# Patient Record
Sex: Male | Born: 1955 | Race: White | Hispanic: No | Marital: Married | Smoking: Former smoker
Health system: Southern US, Community
[De-identification: ages and names within clinical notes are randomized; demographics above are authoritative.]

## PROBLEM LIST (undated history)

## (undated) DIAGNOSIS — R569 Unspecified convulsions: Secondary | ICD-10-CM

## (undated) DIAGNOSIS — F419 Anxiety disorder, unspecified: Secondary | ICD-10-CM

## (undated) DIAGNOSIS — J449 Chronic obstructive pulmonary disease, unspecified: Secondary | ICD-10-CM

## (undated) HISTORY — PX: TOTAL HIP ARTHROPLASTY: SHX124

## (undated) HISTORY — PX: CRANIOTOMY: SHX93

## (undated) HISTORY — PX: APPENDECTOMY: SHX54

---

## 2020-04-10 LAB — PULMONARY FUNCTION TEST

## 2020-11-04 ENCOUNTER — Emergency Department (HOSPITAL_BASED_OUTPATIENT_CLINIC_OR_DEPARTMENT_OTHER): Payer: Medicare Other

## 2020-11-04 ENCOUNTER — Encounter (HOSPITAL_BASED_OUTPATIENT_CLINIC_OR_DEPARTMENT_OTHER): Payer: Self-pay | Admitting: Emergency Medicine

## 2020-11-04 ENCOUNTER — Emergency Department (HOSPITAL_BASED_OUTPATIENT_CLINIC_OR_DEPARTMENT_OTHER)
Admission: EM | Admit: 2020-11-04 | Discharge: 2020-11-04 | Disposition: A | Discharge status: Home / Self Care | Payer: Medicare Other | Attending: Emergency Medicine | Admitting: Emergency Medicine

## 2020-11-04 ENCOUNTER — Other Ambulatory Visit: Payer: Self-pay

## 2020-11-04 DIAGNOSIS — R0602 Shortness of breath: Secondary | ICD-10-CM | POA: Diagnosis not present

## 2020-11-04 DIAGNOSIS — R0789 Other chest pain: Secondary | ICD-10-CM

## 2020-11-04 DIAGNOSIS — J449 Chronic obstructive pulmonary disease, unspecified: Secondary | ICD-10-CM | POA: Diagnosis not present

## 2020-11-04 DIAGNOSIS — Z96641 Presence of right artificial hip joint: Secondary | ICD-10-CM | POA: Diagnosis not present

## 2020-11-04 DIAGNOSIS — Z87891 Personal history of nicotine dependence: Secondary | ICD-10-CM | POA: Insufficient documentation

## 2020-11-04 DIAGNOSIS — R0781 Pleurodynia: Secondary | ICD-10-CM | POA: Insufficient documentation

## 2020-11-04 HISTORY — DX: Chronic obstructive pulmonary disease, unspecified: J44.9

## 2020-11-04 HISTORY — DX: Unspecified convulsions: R56.9

## 2020-11-04 HISTORY — DX: Anxiety disorder, unspecified: F41.9

## 2020-11-04 MED ORDER — ACETAMINOPHEN 500 MG PO TABS
1000.0000 mg | ORAL_TABLET | Freq: Once | ORAL | Status: AC
  Administered 2020-11-04: 1000 mg via ORAL
  Filled 2020-11-04: qty 2

## 2020-11-04 NOTE — Discharge Instructions (Addendum)
You have been seen and discharged from the emergency department.  Your x-ray and CAT scan shows old rib fractures, no new acute findings.  Lung otherwise looks normal.  Treat your pain with Tylenol/ibuprofen.  Follow-up with your primary provider for reevaluation and further care. Take home medications as prescribed. If you have any worsening symptoms or further concerns for your health please return to an emergency department for further evaluation.

## 2020-11-04 NOTE — ED Notes (Signed)
Pt discharged home after verbalizing understanding of discharge instructions; nad noted. 

## 2020-11-04 NOTE — ED Notes (Signed)
Patient verbalizes understanding of discharge instructions. Opportunity for questioning and answers were provided. Patient discharged from ED.  °

## 2020-11-04 NOTE — ED Triage Notes (Signed)
Pt arrives to ED with c/o of left sided rib cage pain x3 days. Pt reports he reached across the car and hurt his left lower ribs on the middle console while reaching for something that fell x3 days ago. This same situation happened again yesterday that worsened the pain. Today pt awoke extremely SOB and now having worsening left lower rib pain. Pt w/ of COPD.
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
## 2021-02-27 ENCOUNTER — Encounter (HOSPITAL_BASED_OUTPATIENT_CLINIC_OR_DEPARTMENT_OTHER): Payer: Self-pay | Admitting: Family Medicine

## 2021-02-27 ENCOUNTER — Other Ambulatory Visit: Payer: Self-pay

## 2021-02-27 ENCOUNTER — Ambulatory Visit (INDEPENDENT_AMBULATORY_CARE_PROVIDER_SITE_OTHER): Payer: Medicare Other | Admitting: Family Medicine

## 2021-02-27 VITALS — BP 131/70 | HR 58 | Ht 76.0 in | Wt 272.0 lb

## 2021-02-27 DIAGNOSIS — G8929 Other chronic pain: Secondary | ICD-10-CM

## 2021-02-27 DIAGNOSIS — J449 Chronic obstructive pulmonary disease, unspecified: Secondary | ICD-10-CM

## 2021-02-27 DIAGNOSIS — F419 Anxiety disorder, unspecified: Secondary | ICD-10-CM | POA: Diagnosis not present

## 2021-02-27 DIAGNOSIS — Z0000 Encounter for general adult medical examination without abnormal findings: Secondary | ICD-10-CM

## 2021-02-27 DIAGNOSIS — M25561 Pain in right knee: Secondary | ICD-10-CM | POA: Diagnosis not present

## 2021-02-27 MED ORDER — RISPERIDONE 1 MG PO TABS: 1.0000 mg | ORAL_TABLET | Freq: Every day | ORAL | 1 refills | Status: DC

## 2021-02-27 MED ORDER — CLONAZEPAM 0.5 MG PO TABS: 0.2500 mg | ORAL_TABLET | Freq: Two times a day (BID) | ORAL | 0 refills | Status: DC

## 2021-02-27 NOTE — Assessment & Plan Note (Signed)
Can continue with current medications, refill of clonazepam today Feel that working towards gradual discontinuation of clonazepam would be best, given that they will be going out of town relatively soon for a prolonged period of time, will hold off on further weaning at this time and plan to resume once they return from their trip

## 2021-02-27 NOTE — Addendum Note (Signed)
Addended by: DE Peru, Marcy Salvo J on: 02/27/2021 03:27 PM   Modules accepted: Orders

## 2021-02-27 NOTE — Assessment & Plan Note (Signed)
Diagnosed on PFTs with prior pulmonologist Not currently requiring any inhalers, not currently symptomatic Will refer to new pulmonologist for patient to establish for continued monitoring and management

## 2021-02-27 NOTE — Assessment & Plan Note (Signed)
Exam mostly unremarkable except for joint line tenderness Discussed possible etiologies, most likely related to arthritis We will proceed with x-ray imaging, further management recommendations pending results of imaging May continue with conservative measures including OTC medications, icing to help with symptoms
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
## 2021-02-27 NOTE — Patient Instructions (Addendum)
°  Medication Instructions:  Your physician recommends that you continue on your current medications as directed. Please refer to the Current Medication list given to you today. --If you need a refill on any your medications before your next appointment, please call your pharmacy first. If no refills are authorized on file call the office.-- Referrals/Procedures/Imaging: A referral has been placed for you to [facility] Pulmonology for evaluation and treatment. Someone from the scheduling department will be in contact with you in regards to coordinating your consultation. If you do not hear from any of the schedulers within 7-10 business days please give their office a call.   Your physician recommends that you have an XRAY of your Right Knee. X-rays are a type of radiation called electromagnetic waves. X-ray imaging creates pictures of the inside of your body. The images show the parts of your body in different shades of black and white. This is because different tissues absorb different amounts of radiation. Calcium in bones absorbs x-rays the most, so bones look white. Fat and other soft tissues absorb less and look gray. Air absorbs the least, so lungs look black     1. You may have this done at the [facility], located in the [facility] Building on the 1st floor.    2. You do no have to have an appointment.    3. [address] [place], [place] [address]        [phone]        Monday - Friday  8:00 am - 5:00 pm 4. [facility]  [facility]. [facility] at [facility] is a premier diagnostic imaging center that offers a 64-slice CT scanner and interventional radiology services.   Follow-Up: Your next appointment:   Your physician recommends that you schedule a follow-up appointment in: 6-9 MONTHS for CPE with Dr. de Peru  You will receive a text message or e-mail with a link to a survey about  your care and experience with [place] today! We would greatly appreciate your feedback!   Thanks for letting us be apart of your health journey!!  Primary Care and Sports Medicine   Dr. Ceasar Mons Peru   We encourage you to activate your patient portal called "MyChart".  Sign up information is provided on this After Visit Summary.  MyChart is used to connect with patients for Virtual Visits (Telemedicine).  Patients are able to view lab/test results, encounter notes, upcoming appointments, etc.  Non-urgent messages can be sent to your provider as well. To learn more about what you can do with MyChart, please visit --  [email or web address].

## 2021-02-28 ENCOUNTER — Ambulatory Visit
Admission: RE | Admit: 2021-02-28 | Discharge: 2021-02-28 | Disposition: A | Discharge status: Home / Self Care | Payer: Medicare Other | Source: Ambulatory Visit | Attending: Family Medicine | Admitting: Family Medicine

## 2021-02-28 DIAGNOSIS — M25561 Pain in right knee: Secondary | ICD-10-CM | POA: Diagnosis not present

## 2021-02-28 DIAGNOSIS — G8929 Other chronic pain: Secondary | ICD-10-CM
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
## 2021-04-22 ENCOUNTER — Ambulatory Visit: Payer: Medicare Other | Admitting: Student

## 2021-04-22 ENCOUNTER — Encounter: Payer: Self-pay | Admitting: Student

## 2021-04-22 ENCOUNTER — Other Ambulatory Visit: Payer: Self-pay

## 2021-04-22 VITALS — BP 128/80 | HR 59 | Temp 98.0°F | Ht 78.0 in | Wt 278.0 lb

## 2021-04-22 DIAGNOSIS — F17200 Nicotine dependence, unspecified, uncomplicated: Secondary | ICD-10-CM

## 2021-04-22 DIAGNOSIS — J449 Chronic obstructive pulmonary disease, unspecified: Secondary | ICD-10-CM

## 2021-04-22 DIAGNOSIS — R053 Chronic cough: Secondary | ICD-10-CM

## 2021-04-22 NOTE — Patient Instructions (Addendum)
-   try flonase 1 spray each nostril after clearing nose of crusting following shower for at least a few weeks - he is due for lung cancer screening CT Chest 10/2021 - see you in 4-6 weeks

## 2021-05-19 ENCOUNTER — Other Ambulatory Visit: Payer: Self-pay | Admitting: *Deleted
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
## 2021-05-20 ENCOUNTER — Other Ambulatory Visit: Payer: Self-pay

## 2021-05-20 ENCOUNTER — Ambulatory Visit: Payer: Medicare Other | Admitting: Student

## 2021-05-20 DIAGNOSIS — J449 Chronic obstructive pulmonary disease, unspecified: Secondary | ICD-10-CM | POA: Diagnosis not present

## 2021-05-20 LAB — PULMONARY FUNCTION TEST
DL/VA % pred: 100 %
DL/VA: 4.01 ml/min/mmHg/L
DLCO cor % pred: 69 %
DLCO cor: 22.83 ml/min/mmHg
DLCO unc % pred: 69 %
DLCO unc: 22.83 ml/min/mmHg
FEF 25-75 Post: 2.75 L/sec
FEF 25-75 Pre: 1.75 L/sec
FEF2575-%Change-Post: 56 %
FEF2575-%Pred-Post: 81 %
FEF2575-%Pred-Pre: 52 %
FEV1-%Change-Post: 12 %
FEV1-%Pred-Post: 57 %
FEV1-%Pred-Pre: 51 %
FEV1-Post: 2.5 L
FEV1-Pre: 2.22 L
FEV1FVC-%Change-Post: 3 %
FEV1FVC-%Pred-Pre: 99 %
FEV6-%Change-Post: 8 %
FEV6-%Pred-Post: 58 %
FEV6-%Pred-Pre: 54 %
FEV6-Post: 3.26 L
FEV6-Pre: 2.99 L
FEV6FVC-%Pred-Post: 104 %
FEV6FVC-%Pred-Pre: 104 %
FVC-%Change-Post: 8 %
FVC-%Pred-Post: 56 %
FVC-%Pred-Pre: 51 %
FVC-Post: 3.26 L
FVC-Pre: 2.99 L
Post FEV1/FVC ratio: 77 %
Post FEV6/FVC ratio: 100 %
Pre FEV1/FVC ratio: 74 %
Pre FEV6/FVC Ratio: 100 %

## 2021-05-20 NOTE — Progress Notes (Signed)
Full PFT performed today. °

## 2021-05-20 NOTE — Patient Instructions (Signed)
Full PFT performed today. °

## 2021-05-21 ENCOUNTER — Encounter: Payer: Self-pay | Admitting: Student

## 2021-05-21 ENCOUNTER — Telehealth: Payer: Self-pay | Admitting: Student

## 2021-05-21 ENCOUNTER — Ambulatory Visit: Payer: Medicare Other | Admitting: Student

## 2021-05-21 VITALS — BP 104/80 | HR 57 | Temp 98.3°F | Ht 77.0 in | Wt 276.0 lb

## 2021-05-21 DIAGNOSIS — R053 Chronic cough: Secondary | ICD-10-CM | POA: Diagnosis not present

## 2021-05-21 DIAGNOSIS — J449 Chronic obstructive pulmonary disease, unspecified: Secondary | ICD-10-CM

## 2021-05-21 MED ORDER — ALBUTEROL SULFATE HFA 108 (90 BASE) MCG/ACT IN AERS: 2.0000 | INHALATION_SPRAY | Freq: Four times a day (QID) | RESPIRATORY_TRACT | 6 refills | Status: AC | PRN

## 2021-05-21 NOTE — Telephone Encounter (Signed)
What is least expensive non-DPI LABA/ICS for him? ? ? ?

## 2021-05-21 NOTE — Patient Instructions (Addendum)
-   can try albuterol 1-2 puffs as needed  ?- I will send a message to pharmacy to see which LABA/ICS (long acting beta agonist and inhaled corticosteroid inhaler) is most affordable if you guys decide to use it. This inhaler used everyday will decrease risk of COPD exacerbation, improve shortness of breath from COPD and is ideal when there is overlap with asthma.  ?- see you in 6 months  ?

## 2021-05-22 ENCOUNTER — Other Ambulatory Visit (HOSPITAL_COMMUNITY): Payer: Self-pay

## 2021-05-22 NOTE — Telephone Encounter (Signed)
Good morning! Test billing results are as follows: ?Symbicort- $47 ?Dulera- $100

## 2021-05-27 ENCOUNTER — Other Ambulatory Visit (HOSPITAL_BASED_OUTPATIENT_CLINIC_OR_DEPARTMENT_OTHER): Payer: Self-pay | Admitting: Family Medicine

## 2021-05-30 NOTE — Telephone Encounter (Signed)
Thanks for checking 

## 2021-06-11 ENCOUNTER — Other Ambulatory Visit (HOSPITAL_BASED_OUTPATIENT_CLINIC_OR_DEPARTMENT_OTHER): Payer: Self-pay | Admitting: Family Medicine

## 2021-07-11 ENCOUNTER — Other Ambulatory Visit (HOSPITAL_BASED_OUTPATIENT_CLINIC_OR_DEPARTMENT_OTHER): Payer: Self-pay | Admitting: Family Medicine

## 2021-08-06 ENCOUNTER — Other Ambulatory Visit (HOSPITAL_BASED_OUTPATIENT_CLINIC_OR_DEPARTMENT_OTHER): Payer: Self-pay | Admitting: Family Medicine

## 2021-08-07 NOTE — Telephone Encounter (Signed)
Pt called and requested refills on clonazePAM (KLONOPIN) 0.5mg  and wanted to know if the Vitamin D could be refilled as well. I refilled the propranolol and risperidone already.  Thanks,  [employer]

## 2021-08-08 MED ORDER — VITAMIN D (ERGOCALCIFEROL) 1.25 MG (50000 UNIT) PO CAPS: 50000.0000 [IU] | ORAL_CAPSULE | ORAL | 0 refills | Status: DC

## 2021-08-08 MED ORDER — CLONAZEPAM 0.5 MG PO TABS: 0.2500 mg | ORAL_TABLET | Freq: Two times a day (BID) | ORAL | 0 refills | Status: DC

## 2021-08-28 ENCOUNTER — Encounter (HOSPITAL_BASED_OUTPATIENT_CLINIC_OR_DEPARTMENT_OTHER): Payer: Medicare Other | Admitting: Family Medicine

## 2021-09-08 ENCOUNTER — Other Ambulatory Visit (HOSPITAL_BASED_OUTPATIENT_CLINIC_OR_DEPARTMENT_OTHER): Payer: Self-pay | Admitting: Family Medicine

## 2021-11-01 ENCOUNTER — Other Ambulatory Visit (HOSPITAL_BASED_OUTPATIENT_CLINIC_OR_DEPARTMENT_OTHER): Payer: Self-pay | Admitting: Family Medicine

## 2021-11-06 ENCOUNTER — Other Ambulatory Visit (HOSPITAL_BASED_OUTPATIENT_CLINIC_OR_DEPARTMENT_OTHER): Payer: Self-pay | Admitting: Family Medicine

## 2021-11-06 ENCOUNTER — Encounter (HOSPITAL_BASED_OUTPATIENT_CLINIC_OR_DEPARTMENT_OTHER): Payer: Self-pay

## 2021-11-06 MED ORDER — CLONAZEPAM 0.5 MG PO TABS: 0.2500 mg | ORAL_TABLET | Freq: Two times a day (BID) | ORAL | 0 refills | Status: DC

## 2021-11-17 ENCOUNTER — Other Ambulatory Visit (HOSPITAL_BASED_OUTPATIENT_CLINIC_OR_DEPARTMENT_OTHER): Payer: Self-pay | Admitting: Family Medicine

## 2021-12-02 NOTE — Telephone Encounter (Signed)
Error

## 2022-02-03 ENCOUNTER — Other Ambulatory Visit (HOSPITAL_BASED_OUTPATIENT_CLINIC_OR_DEPARTMENT_OTHER): Payer: Self-pay | Admitting: Family Medicine

## 2022-02-12 ENCOUNTER — Other Ambulatory Visit (HOSPITAL_BASED_OUTPATIENT_CLINIC_OR_DEPARTMENT_OTHER): Payer: Self-pay | Admitting: Family Medicine

## 2022-02-15 ENCOUNTER — Other Ambulatory Visit (HOSPITAL_BASED_OUTPATIENT_CLINIC_OR_DEPARTMENT_OTHER): Payer: Self-pay | Admitting: Family Medicine

## 2022-02-27 ENCOUNTER — Encounter (HOSPITAL_BASED_OUTPATIENT_CLINIC_OR_DEPARTMENT_OTHER): Payer: Medicare Other | Admitting: Family Medicine

## 2022-03-03 ENCOUNTER — Other Ambulatory Visit (HOSPITAL_BASED_OUTPATIENT_CLINIC_OR_DEPARTMENT_OTHER): Payer: Self-pay

## 2022-03-03 DIAGNOSIS — M25561 Pain in right knee: Secondary | ICD-10-CM

## 2022-03-03 MED ORDER — VITAMIN D (ERGOCALCIFEROL) 1.25 MG (50000 UNIT) PO CAPS: 50000.0000 [IU] | ORAL_CAPSULE | ORAL | 0 refills | Status: AC

## 2022-04-17 ENCOUNTER — Encounter (HOSPITAL_BASED_OUTPATIENT_CLINIC_OR_DEPARTMENT_OTHER): Payer: Self-pay

## 2022-05-02 ENCOUNTER — Other Ambulatory Visit (HOSPITAL_BASED_OUTPATIENT_CLINIC_OR_DEPARTMENT_OTHER): Payer: Self-pay | Admitting: Family Medicine

## 2022-05-06 ENCOUNTER — Other Ambulatory Visit (HOSPITAL_BASED_OUTPATIENT_CLINIC_OR_DEPARTMENT_OTHER): Payer: Self-pay | Admitting: Family Medicine

## 2022-05-20 ENCOUNTER — Other Ambulatory Visit (HOSPITAL_BASED_OUTPATIENT_CLINIC_OR_DEPARTMENT_OTHER): Payer: Self-pay | Admitting: Family Medicine

## 2022-05-21 ENCOUNTER — Other Ambulatory Visit (HOSPITAL_BASED_OUTPATIENT_CLINIC_OR_DEPARTMENT_OTHER): Payer: Self-pay | Admitting: Family Medicine

## 2022-05-22 ENCOUNTER — Other Ambulatory Visit (HOSPITAL_BASED_OUTPATIENT_CLINIC_OR_DEPARTMENT_OTHER): Payer: Self-pay | Admitting: Family Medicine

## 2022-06-10 NOTE — Telephone Encounter (Signed)
Pt has moved to [place], they will not be seeing [place]. I have wished them the best, and removed Dr Tommi Rumps [place] as the PCP.

## 2022-07-27 ENCOUNTER — Other Ambulatory Visit (HOSPITAL_BASED_OUTPATIENT_CLINIC_OR_DEPARTMENT_OTHER): Payer: Self-pay | Admitting: Family Medicine

## 2023-01-19 IMAGING — CR DG KNEE COMPLETE 4+V*R*
4 series · 4 of 4 positions shown · non-contrast
Comparison: None.

CLINICAL DATA: right knee pain. Right knee pain, medial and
lateral, ever since hip surgery.

EXAM:
RIGHT KNEE - COMPLETE 4+ VIEW

[w knee ap right]
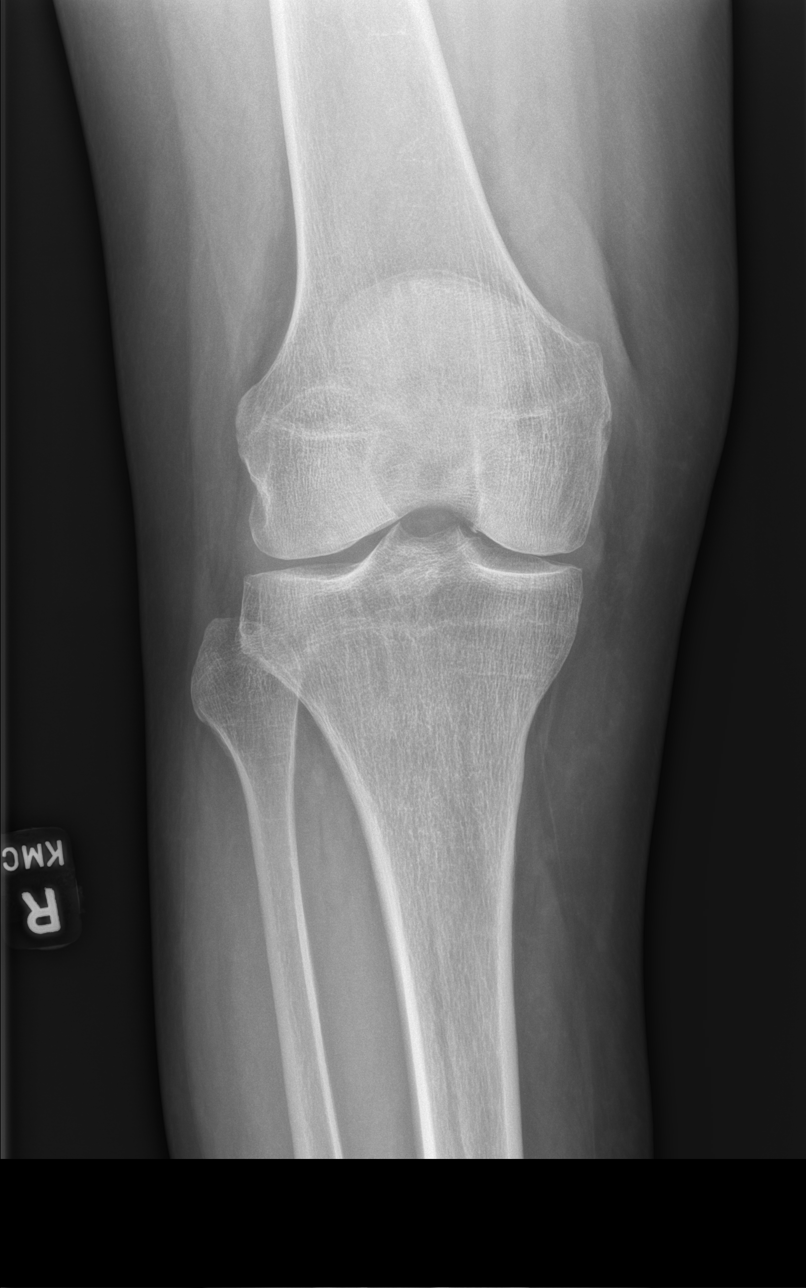

[w knee lat right]
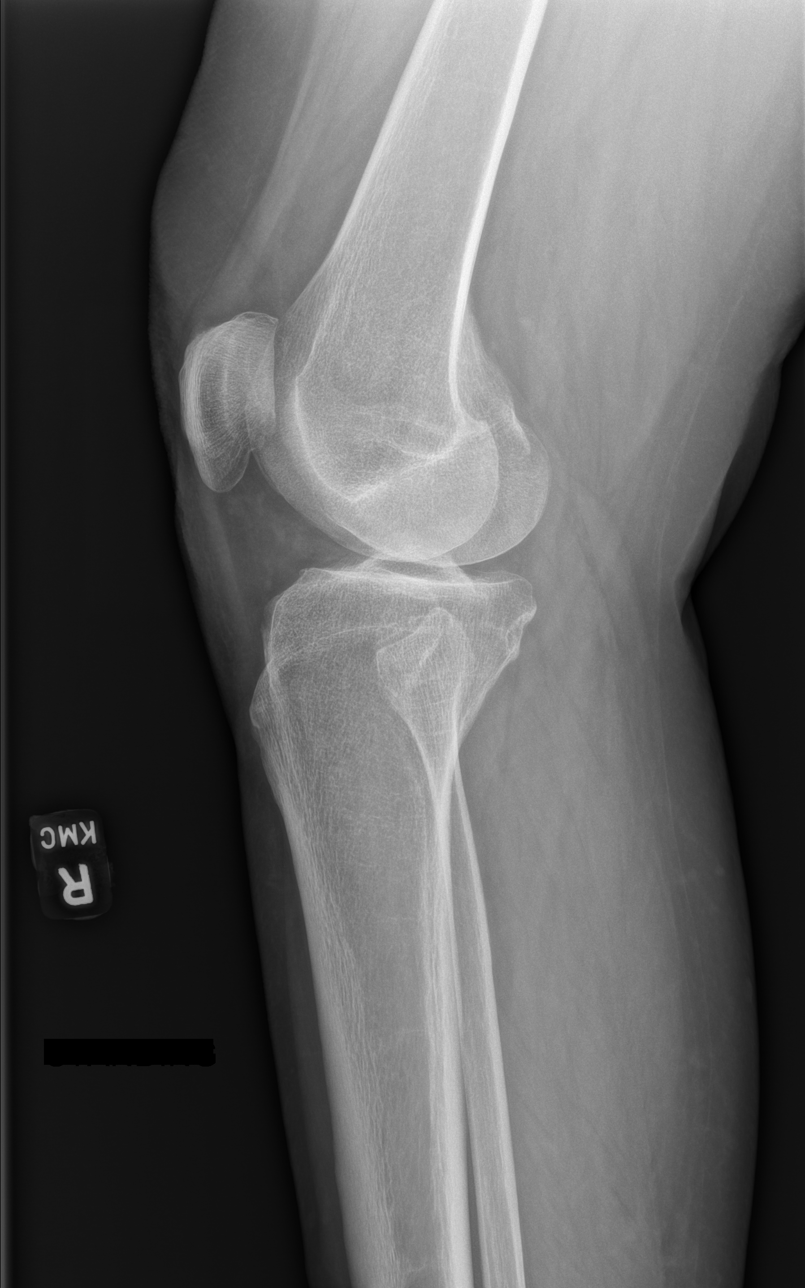

[w knee tunnel pa right]
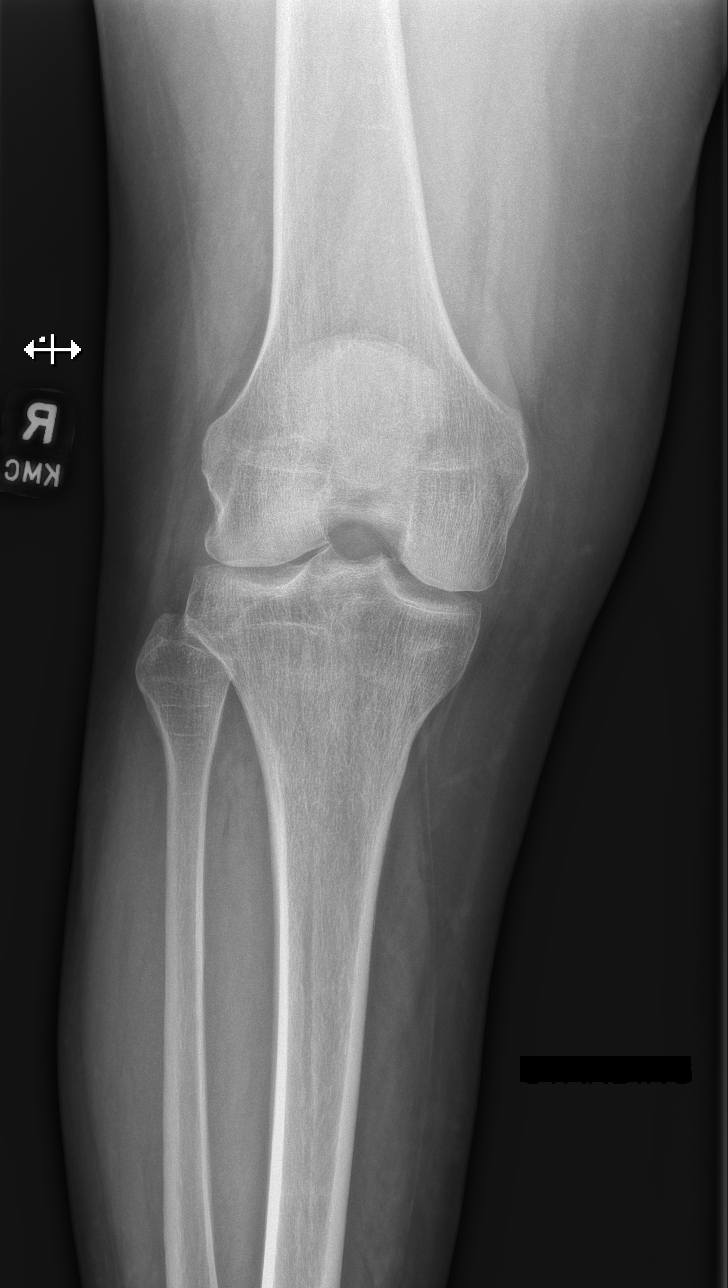

[x knee sunrise right]
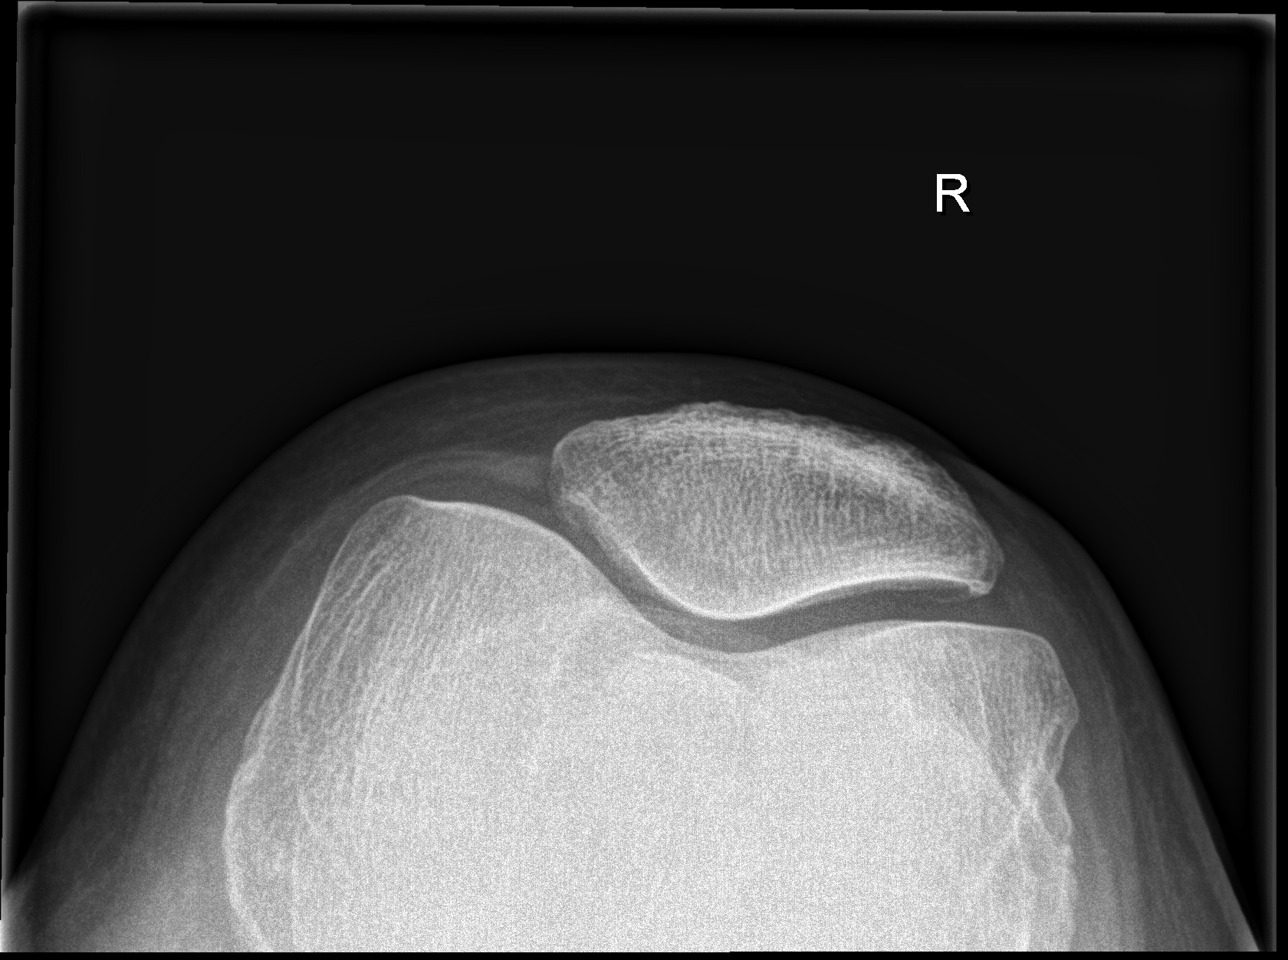

[4 of 4 positions shown; findings below may reference images not displayed]

FINDINGS: No evidence of fracture, dislocation, or joint effusion. No evidence
of severe arthropathy. No aggressive appearing focal bone
abnormality. Soft tissues are unremarkable.
IMPRESSION: Negative.
